# Patient Record
Sex: Male | Born: 2000
Health system: Southern US, Community
[De-identification: ages and names within clinical notes are randomized; demographics above are authoritative.]

## PROBLEM LIST (undated history)

## (undated) DIAGNOSIS — F840 Autistic disorder: Secondary | ICD-10-CM

## (undated) DIAGNOSIS — F419 Anxiety disorder, unspecified: Secondary | ICD-10-CM

## (undated) DIAGNOSIS — F909 Attention-deficit hyperactivity disorder, unspecified type: Secondary | ICD-10-CM

## (undated) DIAGNOSIS — F329 Major depressive disorder, single episode, unspecified: Secondary | ICD-10-CM

## (undated) DIAGNOSIS — G4730 Sleep apnea, unspecified: Secondary | ICD-10-CM

## (undated) DIAGNOSIS — F32A Depression, unspecified: Secondary | ICD-10-CM

## (undated) HISTORY — DX: Attention-deficit hyperactivity disorder, unspecified type: F90.9

## (undated) HISTORY — DX: Major depressive disorder, single episode, unspecified: F32.9

## (undated) HISTORY — DX: Anxiety disorder, unspecified: F41.9

## (undated) HISTORY — PX: ADENOIDECTOMY: SUR15

## (undated) HISTORY — PX: TONSILLECTOMY: SUR1361

## (undated) HISTORY — DX: Depression, unspecified: F32.A

---
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
## 2023-05-21 ENCOUNTER — Telehealth: Payer: Self-pay

## 2023-05-21 NOTE — Transitions of Care (Post Inpatient/ED Visit) (Signed)
   05/21/2023  Name: [place] MRN: [id number] DOB: 12-Apr-2001  Today's TOC FU Call Status: Today's TOC FU Call Status:: Successful TOC FU Call Completed Unsuccessful Call (1st Attempt) Date: 05/21/23 [facility] FU Call Complete Date: 05/21/23  Transition Care Management Follow-up Telephone Call Date of Discharge: 05/20/23 Discharge Facility: Other [facility]) Name of Other (Non[facility]) Discharge Facility: [facility] Type of Discharge: Emergency Department Reason for ED Visit: Other: (left ankle sprain) How have you been since you were released from the hospital?: Better Any questions or concerns?: No  Items Reviewed: Did you receive and understand the discharge instructions provided?: Yes Medications obtained,verified, and reconciled?: Yes (Medications Reviewed) Any new allergies since your discharge?: No Dietary orders reviewed?: Yes Do you have support at home?: Yes People in Home: parent(s)  Medications Reviewed Today: Medications Reviewed Today     Reviewed by Karena Addison, LPN (Licensed Practical Nurse) on 05/21/23 at 1427  Med List Status: <None>   Medication Order Taking? Sig Documenting Provider Last Dose Status Informant  ibuprofen (ADVIL,MOTRIN) 200 MG tablet [id number] No Take 3 tablets (600 mg total) by mouth every 6 (six) hours. Graylin Shiver, MD Taking Active   mupirocin nasal ointment (BACTROBAN) 2 % [id number]  Apply to wound as directed Gabriel Earing, FNP  Active             Home Care and Equipment/Supplies: Were Home Health Services Ordered?: NA Any new equipment or medical supplies ordered?: NA  Functional Questionnaire: Do you need assistance with bathing/showering or dressing?: No Do you need assistance with meal preparation?: No Do you need assistance with eating?: No Do you have difficulty maintaining continence: No Do you need assistance with getting out of bed/getting out of a chair/moving?: No Do you have difficulty managing  or taking your medications?: No  Follow up appointments reviewed: PCP Follow-up appointment confirmed?: No (no avail appts, sent message to staff to schedule) MD Provider Line [phone] Given: No Specialist Hospital Follow-up appointment confirmed?: NA Do you need transportation to your follow-up appointment?: No Do you understand care options if your condition(s) worsen?: Yes-patient verbalized understanding    SIGNATURE Karena Addison, LPN [facility] Nurse Health Advisor Direct Dial [phone]

## 2023-05-21 NOTE — Transitions of Care (Post Inpatient/ED Visit) (Signed)
   05/21/2023  Name: Michael Mcdaniel MRN: [id number] DOB: Mar 08, 2001  Today's TOC FU Call Status: Today's TOC FU Call Status:: Unsuccessful Call (1st Attempt) Unsuccessful Call (1st Attempt) Date: 05/21/23  Attempted to reach the patient regarding the most recent Inpatient/ED visit.  Follow Up Plan: Additional outreach attempts will be made to reach the patient to complete the Transitions of Care (Post Inpatient/ED visit) call.   Signature Karena Addison, LPN [facility] Nurse Health Advisor Direct Dial [phone]

## 2023-05-23 ENCOUNTER — Encounter: Payer: Self-pay | Admitting: Family Medicine

## 2023-05-23 ENCOUNTER — Ambulatory Visit: Payer: Medicaid Other | Admitting: Family Medicine

## 2024-08-06 ENCOUNTER — Emergency Department (HOSPITAL_COMMUNITY)
Admission: EM | Admit: 2024-08-06 | Discharge: 2024-08-06 | Disposition: A | Discharge status: Home / Self Care | Payer: MEDICAID | Attending: Emergency Medicine | Admitting: Emergency Medicine

## 2024-08-06 DIAGNOSIS — L03311 Cellulitis of abdominal wall: Secondary | ICD-10-CM | POA: Diagnosis not present

## 2024-08-06 DIAGNOSIS — K429 Umbilical hernia without obstruction or gangrene: Secondary | ICD-10-CM | POA: Diagnosis not present

## 2024-08-06 DIAGNOSIS — R1033 Periumbilical pain: Secondary | ICD-10-CM | POA: Diagnosis present

## 2024-08-06 LAB — CBC WITH DIFFERENTIAL/PLATELET
Abs Immature Granulocytes: 0.02 K/uL (ref 0.00–0.07)
Basophils Absolute: 0 K/uL (ref 0.0–0.1)
Basophils Relative: 1 %
Eosinophils Absolute: 0.2 K/uL (ref 0.0–0.5)
Eosinophils Relative: 2 %
HCT: 40.3 % (ref 39.0–52.0)
Hemoglobin: 13.5 g/dL (ref 13.0–17.0)
Immature Granulocytes: 0 %
Lymphocytes Relative: 26 %
Lymphs Abs: 1.8 K/uL (ref 0.7–4.0)
MCH: 30 pg (ref 26.0–34.0)
MCHC: 33.5 g/dL (ref 30.0–36.0)
MCV: 89.6 fL (ref 80.0–100.0)
Monocytes Absolute: 0.6 K/uL (ref 0.1–1.0)
Monocytes Relative: 8 %
Neutro Abs: 4.6 K/uL (ref 1.7–7.7)
Neutrophils Relative %: 63 %
Platelets: 257 K/uL (ref 150–400)
RBC: 4.5 MIL/uL (ref 4.22–5.81)
RDW: 13.1 % (ref 11.5–15.5)
WBC: 7.2 K/uL (ref 4.0–10.5)
nRBC: 0 % (ref 0.0–0.2)

## 2024-08-06 LAB — COMPREHENSIVE METABOLIC PANEL WITH GFR
ALT: 39 U/L (ref 0–44)
AST: 33 U/L (ref 15–41)
Albumin: 4.5 g/dL (ref 3.5–5.0)
Alkaline Phosphatase: 74 U/L (ref 38–126)
Anion gap: 12 (ref 5–15)
BUN: 13 mg/dL (ref 6–20)
CO2: 25 mmol/L (ref 22–32)
Calcium: 9.7 mg/dL (ref 8.9–10.3)
Chloride: 104 mmol/L (ref 98–111)
Creatinine, Ser: 0.94 mg/dL (ref 0.61–1.24)
GFR, Estimated: >60 mL/min (ref >60)
Glucose, Bld: 92 mg/dL (ref 70–99)
Potassium: 3.9 mmol/L (ref 3.5–5.1)
Sodium: 141 mmol/L (ref 135–145)
Total Bilirubin: 1.1 mg/dL (ref 0.0–1.2)
Total Protein: 8.1 g/dL (ref 6.5–8.1)

## 2024-08-06 LAB — I-STAT CG4 LACTIC ACID, ED: Lactic Acid, Venous: 0.7 mmol/L (ref 0.5–1.9)

## 2024-08-06 LAB — LIPASE, BLOOD: Lipase: 29 U/L (ref 11–51)

## 2024-08-06 MED ORDER — KETOROLAC TROMETHAMINE 15 MG/ML IJ SOLN
15.0000 mg | Freq: Once | INTRAMUSCULAR | Status: AC
  Administered 2024-08-06: 15 mg via INTRAVENOUS
  Filled 2024-08-06: qty 1

## 2024-08-06 MED ORDER — DOXYCYCLINE HYCLATE 100 MG PO CAPS: 100.0000 mg | ORAL_CAPSULE | Freq: Two times a day (BID) | ORAL | 0 refills | Status: AC

## 2024-08-06 MED ORDER — MUPIROCIN 2 % EX OINT
TOPICAL_OINTMENT | Freq: Two times a day (BID) | CUTANEOUS | Status: DC
  Filled 2024-08-06: qty 22

## 2024-08-06 MED ORDER — MUPIROCIN CALCIUM 2 % EX CREA: 1.0000 | TOPICAL_CREAM | Freq: Two times a day (BID) | CUTANEOUS | 0 refills | Status: AC

## 2024-08-06 MED ORDER — DOXYCYCLINE HYCLATE 100 MG PO TABS
100.0000 mg | ORAL_TABLET | Freq: Once | ORAL | Status: AC
  Administered 2024-08-06: 100 mg via ORAL
  Filled 2024-08-06: qty 1

## 2024-08-06 NOTE — ED Triage Notes (Signed)
 Seen at [facility] last night and dx with hernia, but states they didn't do anything, however symptoms of nausea have worsened and is now having yellow drainage from his belly button.

## 2024-08-06 NOTE — Discharge Instructions (Signed)
 As we discussed, I would like for you to follow-up with [place] surgery next week.  I given you a work note through Monday.  Please pick up the antibiotics at [facility].  You can return to the emergency department for any worsening symptoms.

## 2024-08-06 NOTE — ED Provider Notes (Signed)
  EMERGENCY DEPARTMENT AT [facility] Provider Note   CSN: [id number] Arrival date & time: 08/06/24  1346     Patient presents with: Hernia   Michael Mcdaniel is a 23 y.o. male patient who presents to the emergency department today for further evaluation of periumbilical abdominal pain which been present for last 3 days.  Per chart review, patient was seen evaluated over in [facility] for similar symptoms yesterday.  Had a CT scan that did show a fat-containing umbilical hernia with probable cellulitis with no obvious abscess collection.  Patient was ultimately discharged and given antibiotics.  He states that the pharmacy was down all day and he was unable to get it filled and the pain and swelling and surrounding erythema has gotten worse prompting his arrival here.  He does report subjective fever and some chills with nausea without vomiting.  No diarrhea.   HPI     Prior to Admission medications   Medication Sig Start Date End Date Taking? Authorizing Provider  doxycycline  (VIBRAMYCIN ) 100 MG capsule Take 1 capsule (100 mg total) by mouth 2 (two) times daily. 08/06/24  Yes Theotis, Dylin Ihnen M, PA-C  mupirocin  cream (BACTROBAN ) 2 % Apply 1 Application topically 2 (two) times daily. 08/06/24  Yes Lizeth Bencosme M, PA-C  ibuprofen  (ADVIL ,MOTRIN ) 200 MG tablet Take 3 tablets (600 mg total) by mouth every 6 (six) hours. 12/11/18   Marcellino, Amanda J, MD  mupirocin  nasal ointment (BACTROBAN ) 2 % Apply to wound as directed 07/13/21   Joesph Annabella HERO, FNP    Allergies: Patient has no known allergies.    Review of Systems  Updated Vital Signs BP 123/71 (BP Location: Left Arm)   Pulse 83   Temp 99 F (37.2 C) (Oral)   Resp 18   SpO2 98%   Physical Exam Vitals and nursing note reviewed.  Constitutional:      General: He is not in acute distress.    Appearance: Normal appearance.  HENT:     Head: Normocephalic and atraumatic.  Eyes:     General:         Right eye: No discharge.        Left eye: No discharge.  Cardiovascular:     Comments: Regular rate and rhythm.  S1/S2 are distinct without any evidence of murmur, rubs, or gallops.  Radial pulses are 2+ bilaterally.  Dorsalis pedis pulses are 2+ bilaterally.  No evidence of pedal edema. Pulmonary:     Comments: Clear to auscultation bilaterally.  Normal effort.  No respiratory distress.  No evidence of wheezes, rales, or rhonchi heard throughout. Abdominal:     General: Abdomen is flat. Bowel sounds are normal. There is no distension.     Tenderness: There is no abdominal tenderness. There is no guarding or rebound.   Musculoskeletal:        General: Normal range of motion.     Cervical back: Neck supple.  Skin:    General: Skin is warm and dry.     Findings: No rash.  Neurological:     General: No focal deficit present.     Mental Status: He is alert.  Psychiatric:        Mood and Affect: Mood normal.        Behavior: Behavior normal.      (all labs ordered are listed, but only abnormal results are displayed) Labs Reviewed  CBC WITH DIFFERENTIAL/PLATELET  COMPREHENSIVE METABOLIC PANEL WITH GFR  LIPASE, BLOOD  I-STAT CG4  LACTIC ACID, ED    EKG: None  Radiology: No results found.   Procedures   Medications Ordered in the ED  mupirocin  ointment (BACTROBAN ) 2 % (has no administration in time range)  ketorolac  (TORADOL ) 15 MG/ML injection 15 mg (15 mg Intravenous Given 08/06/24 1746)  doxycycline  (VIBRA -TABS) tablet 100 mg (100 mg Oral Given 08/06/24 1919)    Clinical Course as of 08/06/24 1928  Thu Aug 06, 2024  1852 I spoke with Dr. Debby with general surgery who recommends follow-up in the outpatient setting with antibiotic therapy [CF]  1919 CBC with Differential Negative.  [CF]  1919 Comprehensive metabolic panel Negative.  [CF]  1919 I-Stat CG4 Lactic Acid Negative.  [CF]    Clinical Course User Index [CF] Theotis Cameron HERO, PA-C    Medical Decision  Making Michael Mcdaniel is a 23 y.o. male patient who presents to the emergency department today for further evaluation of umbilical hernia and surrounding cellulitis.  Patient has not had any outpatient therapy yet as far as antibiotics.  Will plan to repeat some basic labs as well.  He is overall nontoxic-appearing.  Hernia does not appear to be obstructed at this time.  Did not feel that repeat imaging is necessary.  Will give him doxycycline  and mupirocin  here which is what he supposed to be on.  Labs are all reassuring.  No evidence of leukocytosis.  Again low suspicion for sepsis at this time or incarcerated umbilical hernia.  Will plan to discharge him with doxycycline  and mupirocin  and changed to a new pharmacy here locally in town she can go get it.  I will also give him referral to [facility] surgery.  His dad had a hernia repaired there in the past.  Strict turn precautions were discussed with the patient and family at bedside.  All questions or concerns addressed.  They are agreeable with plan.  He is safe for discharge.   Amount and/or Complexity of Data Reviewed Labs: ordered. Decision-making details documented in ED Course.  Risk Prescription drug management.    Final diagnoses:  Umbilical hernia without obstruction and without gangrene  Cellulitis of abdominal wall    ED Discharge Orders          Ordered    doxycycline  (VIBRAMYCIN ) 100 MG capsule  2 times daily        08/06/24 1922    mupirocin  cream (BACTROBAN ) 2 %  2 times daily        08/06/24 [address] [place], [place] 08/06/24 1928    Garrick Charleston, MD 08/07/24 1004

## 2024-08-08 ENCOUNTER — Telehealth: Payer: Self-pay | Admitting: General Surgery

## 2024-08-08 NOTE — Telephone Encounter (Signed)
 Powell Ned called regarding her bonus son Michael Mcdaniel for increased pain at his umbilical hernia site.  The patient has had an incarcerated umbilical hernia with omentum documented on CT scan.  This has gotten infected and he is on doxycycline  for this and given prescription for an ointment to place on the skin that was thinned out.  The patient told Ms. Ned that the pain was radiating up into his rib cage now and there was now drainage from the hernia site.  I advised that the patient with increasing pain likely should be seen in the emergency department.  They were reluctant as they have been sent home several times from the emergency department.  I discussed things that were absolute indications to come were spreading redness, increased size of the hernia, or distended abdomen with nausea and vomiting.  Advised if those were going on that he 100% should come to the emergency department.  I stated that for other problems we could not be sure of what is going on without him being evaluated.  I did state that if the patient is truly having worsening pain that he should be seen, but did sympathize with the significant wait that can occur at the emergency department.  Advised that frequently [facility] and [facility] do not have quite as long wait so that might be a possibility for slightly easier method to be evaluated.

## 2024-08-17 ENCOUNTER — Ambulatory Visit: Payer: MEDICAID | Admitting: Family Medicine

## 2024-11-10 ENCOUNTER — Ambulatory Visit: Payer: Self-pay | Admitting: Surgery

## 2024-11-25 ENCOUNTER — Encounter (HOSPITAL_BASED_OUTPATIENT_CLINIC_OR_DEPARTMENT_OTHER): Admission: RE | Payer: Self-pay | Source: Home / Self Care

## 2024-11-25 ENCOUNTER — Ambulatory Visit (HOSPITAL_BASED_OUTPATIENT_CLINIC_OR_DEPARTMENT_OTHER): Admission: RE | Admit: 2024-11-25 | Payer: MEDICAID | Source: Home / Self Care | Admitting: Surgery

## 2024-11-25 SURGERY — REPAIR, HERNIA, UMBILICAL, ADULT
Anesthesia: General
